# Patient Record
Sex: Male | Born: 1965 | Race: White | Hispanic: No | Marital: Married | Smoking: Former smoker
Health system: Southern US, Community
[De-identification: ages and names within clinical notes are randomized; demographics above are authoritative.]

## PROBLEM LIST (undated history)

## (undated) DIAGNOSIS — I219 Acute myocardial infarction, unspecified: Secondary | ICD-10-CM

## (undated) DIAGNOSIS — E785 Hyperlipidemia, unspecified: Secondary | ICD-10-CM

## (undated) HISTORY — PX: OTHER SURGICAL HISTORY: SHX169

## (undated) HISTORY — PX: KNEE ARTHROSCOPY: SUR90

---

## 2010-05-04 ENCOUNTER — Inpatient Hospital Stay: Payer: Self-pay | Admitting: Internal Medicine

## 2011-12-08 ENCOUNTER — Ambulatory Visit: Payer: Self-pay | Admitting: Otolaryngology

## 2011-12-15 ENCOUNTER — Ambulatory Visit: Payer: Self-pay | Admitting: Otolaryngology

## 2011-12-17 ENCOUNTER — Ambulatory Visit: Payer: Self-pay | Admitting: Otolaryngology

## 2011-12-17 LAB — PROTIME-INR: Prothrombin Time: 12 secs (ref 11.5–14.7)

## 2012-02-05 IMAGING — CR DG CHEST 1V PORT
1 series · 1 of 1 positions shown · non-contrast
Comparison: none

REASON FOR EXAM: CP
COMMENTS:

PROCEDURE:     DXR - DXR PORTABLE CHEST SINGLE VIEW  - May 04, 2010  [DATE]
RESULT:     Comparison: None.

[view not recorded]
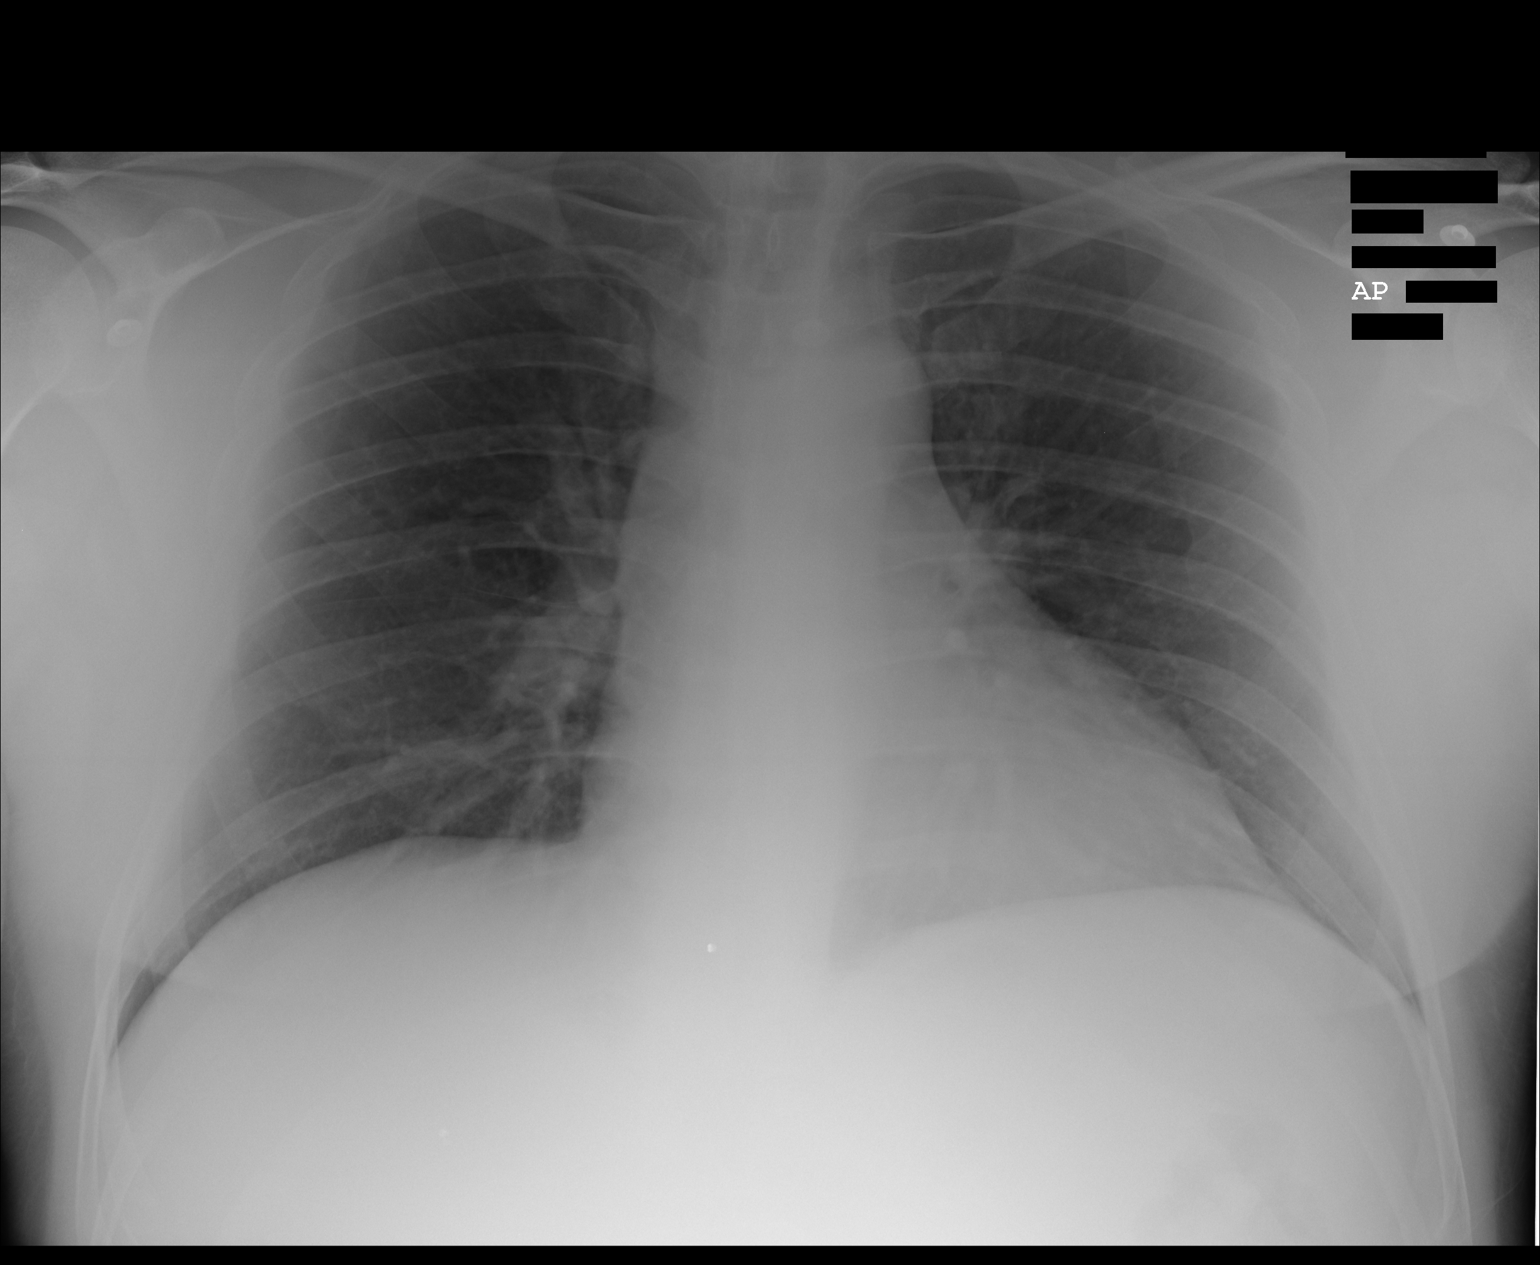

[1 of 1 positions shown; findings below may reference images not displayed]

FINDINGS: The heart is mildly enlarged. The lungs are clear.
IMPRESSION: No acute cardiopulmonary series.

## 2013-09-10 IMAGING — CT CT NECK WITH CONTRAST
1 of 2 series · 9 of 14 positions shown, 12 images · IV contrast (agent unspecified)
Comparison: none

REASON FOR EXAM: rt paratid tail mass
COMMENTS:

PROCEDURE:     KCT - KCT NECK WITH CONTRAST  - December 08, 2011  [DATE]
RESULT:
TECHNIQUE: Helical 3 mm sections were obtained from the skull base to the
vertex status post intravenous administration of 75 ml of Csovue-3IL.

[Series 2: neck 3.0 3 · axial · 0.43mm/px · z∈[-304,-62]mm · 9 of 103 slices shown, 12 images]
[im 11/103  soft-tissue]
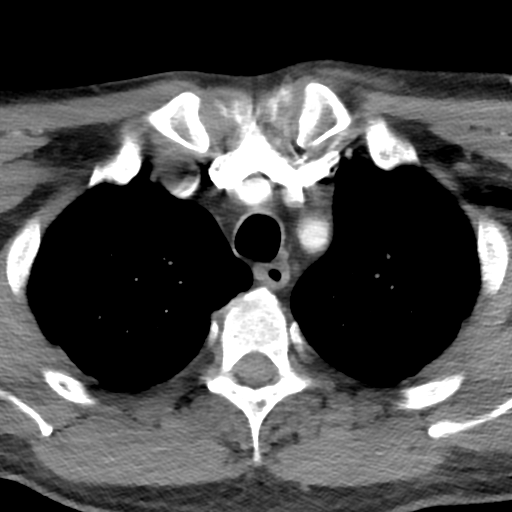
[im 11/103  bone]
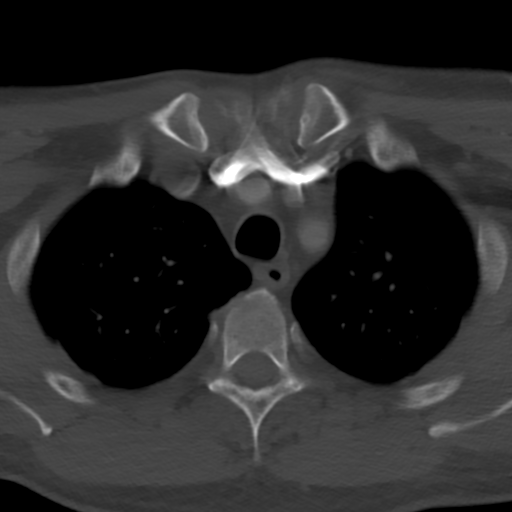
[im 21/103  bone]
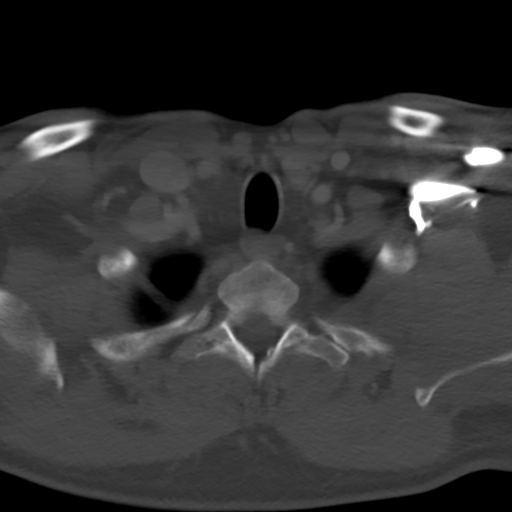
[im 31/103  bone]
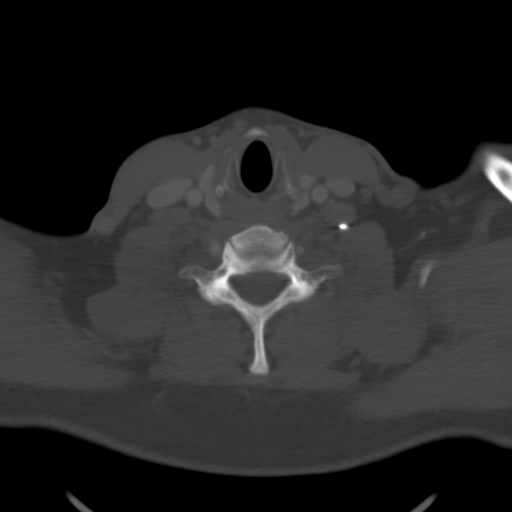
[im 41/103  bone]
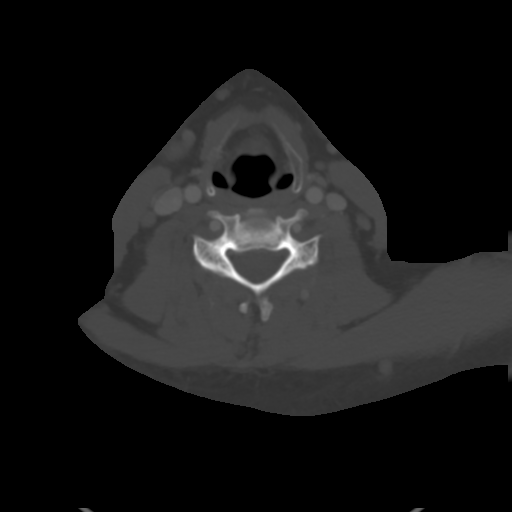
[im 52/103  soft-tissue]
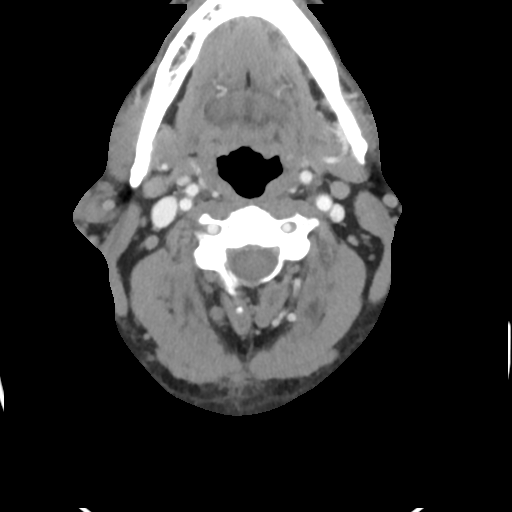
[im 52/103  bone]
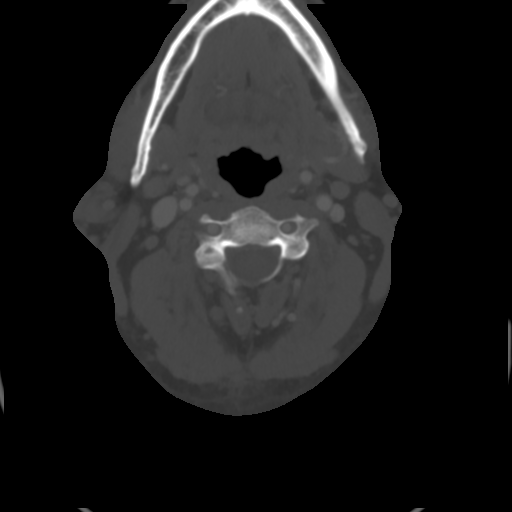
[im 62/103  bone]
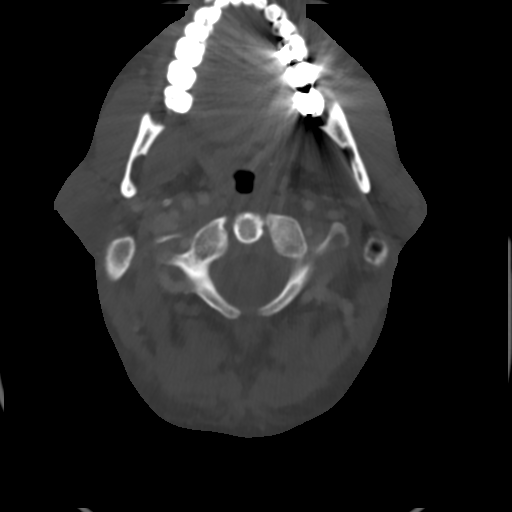
[im 72/103  bone]
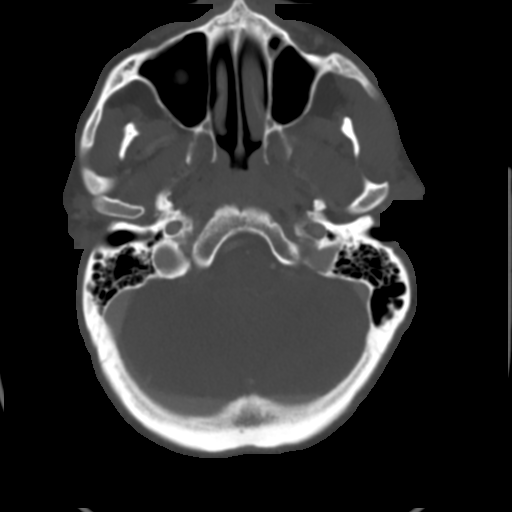
[im 82/103  bone]
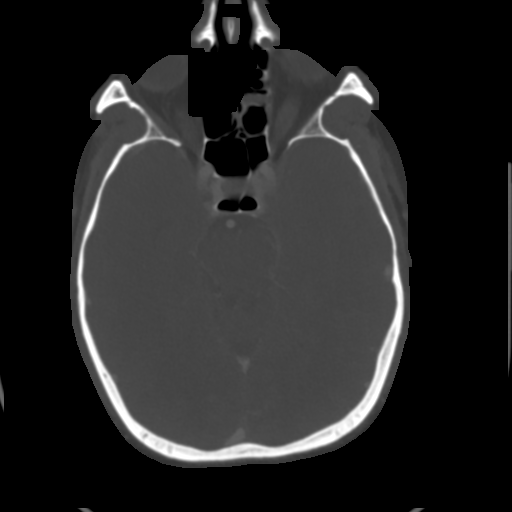
[im 92/103  soft-tissue]
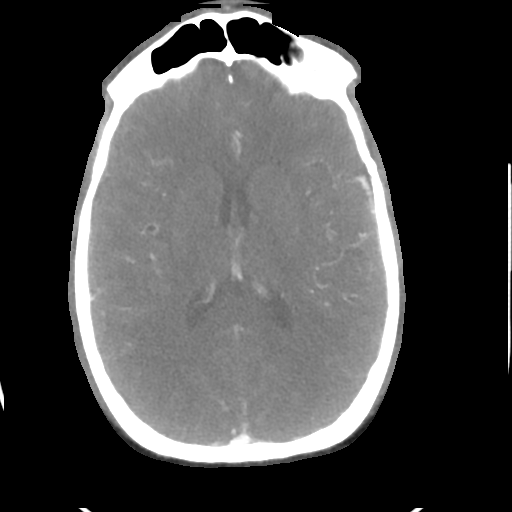
[im 92/103  bone]
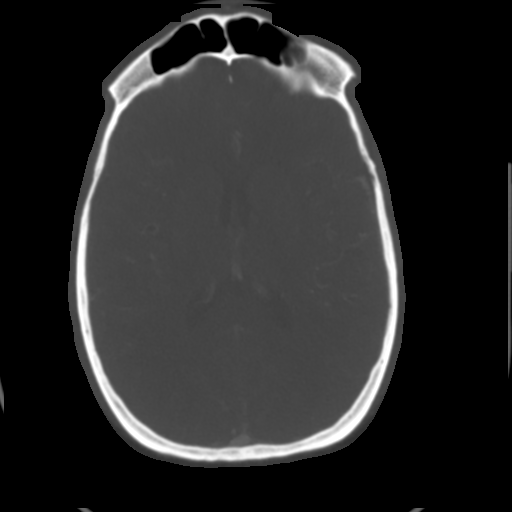

[9 of 14 positions shown; findings below may reference images not displayed]

FINDINGS: A 2.79 x 2.66 cm mild to moderately enhancing mass is identified
deep to the parotid at the level of the ramus of the mandible on the right.
This mass-like area extends to the tail region of the parotid and has a more
solid appearance. Enlarged, paraparotid lymph nodes are identified as well
as prominent lymph nodes in the parotid space. Prominent lymph nodes are
identified within the anterior and posterior cervical chains bilaterally.

Dystrophic calcification is identified within the lingual tonsils. The
glottic and epiglottic regions are otherwise unremarkable. Opacified
vascular structures are unremarkable. The submandibular glands are symmetric
and unremarkable. The left parotid region is unremarkable. The airway is
patent.
IMPRESSION: Mild to moderately heterogeneous enhancing mass deep to the
parotid on the right. There is associated adenopathy particularly within the
parotid space region.

## 2013-12-30 ENCOUNTER — Ambulatory Visit: Payer: Self-pay | Admitting: Gastroenterology

## 2014-01-02 LAB — PATHOLOGY REPORT

## 2014-08-22 NOTE — Op Note (Signed)
PATIENT NAME:  Spencer Preston, Spencer Preston MR#:  [id number] DATE OF BIRTH:  [birth year]  DATE OF PROCEDURE:  12/17/2011  PREOPERATIVE DIAGNOSIS: Right parotid mass.   POSTOPERATIVE DIAGNOSIS: Right parotid mass.  PROCEDURE: Right superficial parotidectomy.   SURGEON: Marion DownerScott Landa Mullinax, MD   FIRST ASSISTANT: Karlyne GreenspanWilliam Vaught, MD   ANESTHESIA: General endotracheal.   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   FINDINGS: This was about a 2.5 cm right parotid mass near the tail of the parotid.   COMPLICATIONS: None.   DESCRIPTION OF PROCEDURE: After obtaining informed consent, the patient was taken to the Operating Room and placed in the supine position. After induction of general endotracheal anesthesia, the patient was turned 90 degrees and placed in a shoulder roll. The skin was injected with 1% lidocaine with epinephrine 1:200,000. He was then prepped and draped in the usual sterile fashion. A 15 blade was used to incise the skin in front of the ear and down into the neck in Preston-shaped type incision. The incision was carried down through the subcutaneous fat with the Bovie. A skin flap was then elevated over the parotid fascia and down into the neck. Dissection proceeded down to the sternocleidomastoid inferiorly, and careful dissection along the tragal cartilage was performed down towards the region of the facial nerve. The intervening tissue between the tragal cartilage and the sternocleidomastoid muscle was divided as encountered predominantly with the Harmonic scalpel. The facial nerve trunk was very difficult to find. It was located a little more inferiorly than usual, and he had a very prominent mastoid tip which contributed to difficulties in identifying the nerve. He also had an extremely thick parotid gland. Eventually, the trunk of the nerve was identified and dissected out. The superficial lobe of the parotid gland was dissected  away from the facial nerve carefully, carefully tracing all branches out as the gland was dissected away. Identity of branches were confirmed as needed with the nerve stimulator. The superior and inferior as well as middle facial nerve branches were all dissected out as the gland was dissected away from the nerve and removed with the tumor. The deep margin of the tumor was right on the region of the facial nerve, but the tumor itself was well encapsulated. With all the nerve branches identified and dissected out, the anterior aspect of the gland was divided, delivering the tumor with it. The wound was irrigated with saline and minor bleeding controlled with the bipolar cautery. The remaining parotid fascia was tacked back as much as possible against the sternocleidomastoid to close some of the dead space and the deep layers closed with 4-0 Vicryl suture. The skin was closed with 5-0 Prolene suture in a running lock stitch. Prior to closure, a #10 TLS drain had been placed and was secured with a 4-0 Ethilon suture and placed to suction. The patient was then returned to the anesthesiologist for awakening. He was awakened and taken to the recovery room in good condition postoperatively. Blood loss was approximately 50 mL.   ____________________________ Spencer GrossPaul Preston. Willeen CassBennett, MD psb:cbb D: 12/17/2011 12:08:20 ET T: 12/17/2011 12:32:14 ET JOB#: [id number]  cc: Spencer GrossPaul Preston. Willeen CassBennett, MD, <Dictator> Sandi MealyPAUL Preston Spencer Trumbull MD ELECTRONICALLY SIGNED 01/13/2012 8:35

## 2019-06-27 ENCOUNTER — Other Ambulatory Visit
Admission: RE | Admit: 2019-06-27 | Discharge: 2019-06-27 | Disposition: A | Discharge status: Home / Self Care | Payer: BC Managed Care – PPO | Source: Ambulatory Visit | Attending: Internal Medicine | Admitting: Internal Medicine

## 2019-06-27 ENCOUNTER — Other Ambulatory Visit: Payer: Self-pay

## 2019-06-27 DIAGNOSIS — Z01812 Encounter for preprocedural laboratory examination: Secondary | ICD-10-CM | POA: Diagnosis present

## 2019-06-27 DIAGNOSIS — Z20822 Contact with and (suspected) exposure to covid-19: Secondary | ICD-10-CM | POA: Insufficient documentation

## 2019-06-27 LAB — SARS CORONAVIRUS 2 (TAT 6-24 HRS): SARS Coronavirus 2: NEGATIVE

## 2019-06-28 ENCOUNTER — Encounter: Payer: Self-pay | Admitting: Internal Medicine

## 2019-06-29 ENCOUNTER — Ambulatory Visit: Payer: BC Managed Care – PPO | Admitting: Anesthesiology

## 2019-06-29 ENCOUNTER — Encounter: Payer: Self-pay | Admitting: Internal Medicine

## 2019-06-29 ENCOUNTER — Other Ambulatory Visit: Payer: Self-pay

## 2019-06-29 ENCOUNTER — Encounter
Admission: RE | Disposition: A | Discharge status: Home / Self Care | Payer: Self-pay | Source: Home / Self Care | Attending: Internal Medicine

## 2019-06-29 ENCOUNTER — Ambulatory Visit
Admission: RE | Admit: 2019-06-29 | Discharge: 2019-06-29 | Disposition: A | Discharge status: Home / Self Care | Payer: BC Managed Care – PPO | Attending: Internal Medicine | Admitting: Internal Medicine

## 2019-06-29 DIAGNOSIS — E785 Hyperlipidemia, unspecified: Secondary | ICD-10-CM | POA: Insufficient documentation

## 2019-06-29 DIAGNOSIS — I252 Old myocardial infarction: Secondary | ICD-10-CM | POA: Diagnosis not present

## 2019-06-29 DIAGNOSIS — Z888 Allergy status to other drugs, medicaments and biological substances status: Secondary | ICD-10-CM | POA: Diagnosis not present

## 2019-06-29 DIAGNOSIS — Z79899 Other long term (current) drug therapy: Secondary | ICD-10-CM | POA: Insufficient documentation

## 2019-06-29 DIAGNOSIS — Z955 Presence of coronary angioplasty implant and graft: Secondary | ICD-10-CM | POA: Insufficient documentation

## 2019-06-29 DIAGNOSIS — I2510 Atherosclerotic heart disease of native coronary artery without angina pectoris: Secondary | ICD-10-CM | POA: Diagnosis not present

## 2019-06-29 DIAGNOSIS — Z1211 Encounter for screening for malignant neoplasm of colon: Secondary | ICD-10-CM | POA: Insufficient documentation

## 2019-06-29 DIAGNOSIS — Z7982 Long term (current) use of aspirin: Secondary | ICD-10-CM | POA: Diagnosis not present

## 2019-06-29 DIAGNOSIS — Z881 Allergy status to other antibiotic agents status: Secondary | ICD-10-CM | POA: Insufficient documentation

## 2019-06-29 DIAGNOSIS — Z86010 Personal history of colonic polyps: Secondary | ICD-10-CM | POA: Diagnosis not present

## 2019-06-29 HISTORY — DX: Acute myocardial infarction, unspecified: I21.9

## 2019-06-29 HISTORY — PX: COLONOSCOPY WITH PROPOFOL: SHX5780

## 2019-06-29 HISTORY — DX: Hyperlipidemia, unspecified: E78.5

## 2019-06-29 SURGERY — COLONOSCOPY WITH PROPOFOL
Anesthesia: General

## 2019-06-29 MED ORDER — PROPOFOL 500 MG/50ML IV EMUL
INTRAVENOUS | Status: DC | PRN
  Administered 2019-06-29: 175 ug/kg/min via INTRAVENOUS

## 2019-06-29 MED ORDER — PROPOFOL 500 MG/50ML IV EMUL
INTRAVENOUS | Status: AC
  Filled 2019-06-29: qty 50

## 2019-06-29 MED ORDER — LIDOCAINE HCL (CARDIAC) PF 100 MG/5ML IV SOSY
PREFILLED_SYRINGE | INTRAVENOUS | Status: DC | PRN
  Administered 2019-06-29: 50 mg via INTRAVENOUS

## 2019-06-29 MED ORDER — SODIUM CHLORIDE 0.9 % IV SOLN: INTRAVENOUS | Status: DC

## 2019-06-29 MED ORDER — PROPOFOL 10 MG/ML IV BOLUS
INTRAVENOUS | Status: DC | PRN
  Administered 2019-06-29: 10 mg via INTRAVENOUS
  Administered 2019-06-29: 70 mg via INTRAVENOUS

## 2019-06-29 NOTE — Anesthesia Preprocedure Evaluation (Signed)
Anesthesia Evaluation  Patient identified by MRN, date of birth, ID band Patient awake    Reviewed: Allergy & Precautions, H&P , NPO status , Patient's Chart, lab work & pertinent test results, reviewed documented beta blocker date and time   History of Anesthesia Complications Negative for: history of anesthetic complications  Airway Mallampati: I  TM Distance: >3 FB Neck ROM: full    Dental  (+) Dental Advidsory Given, Teeth Intact   Pulmonary neg shortness of breath, neg COPD, Recent URI , Resolved, former smoker,    Pulmonary exam normal        Cardiovascular Exercise Tolerance: Good hypertension, (-) angina+ CAD, + Past MI and + Cardiac Stents  (-) CABG Normal cardiovascular exam(-) dysrhythmias (-) Valvular Problems/Murmurs     Neuro/Psych negative neurological ROS  negative psych ROS   GI/Hepatic negative GI ROS, Neg liver ROS,   Endo/Other  negative endocrine ROS  Renal/GU negative Renal ROS  negative genitourinary   Musculoskeletal   Abdominal   Peds  Hematology negative hematology ROS (+)   Anesthesia Other Findings Past Medical History: No date: Hyperlipidemia No date: Myocardial infarction (HCC)   Reproductive/Obstetrics negative OB ROS                             Anesthesia Physical Anesthesia Plan  ASA: II  Anesthesia Plan: General   Post-op Pain Management:    Induction: Intravenous  PONV Risk Score and Plan: 2 and Propofol infusion and TIVA  Airway Management Planned: Nasal Cannula and Natural Airway  Additional Equipment:   Intra-op Plan:   Post-operative Plan:   Informed Consent: I have reviewed the patients History and Physical, chart, labs and discussed the procedure including the risks, benefits and alternatives for the proposed anesthesia with the patient or authorized representative who has indicated his/her understanding and acceptance.      Dental Advisory Given  Plan Discussed with: Anesthesiologist, CRNA and Surgeon  Anesthesia Plan Comments:         Anesthesia Quick Evaluation

## 2019-06-29 NOTE — Anesthesia Procedure Notes (Signed)
Date/Time: 06/29/2019 9:03 AM Performed by: Ginger Carne, CRNA Pre-anesthesia Checklist: Patient identified, Emergency Drugs available, Suction available, Patient being monitored and Timeout performed Patient Re-evaluated:Patient Re-evaluated prior to induction Oxygen Delivery Method: Nasal cannula Preoxygenation: Pre-oxygenation with 100% oxygen

## 2019-06-29 NOTE — Op Note (Signed)
[facility] Gastroenterology Patient Name: Spencer Preston Procedure Date: 06/29/2019 8:55 AM MRN: [id number] Account #: [id number] Date of Birth: [birth year] Admit Type: Outpatient Age: 54 Room: [facility] ENDO ROOM 3 Gender: Male Note Status: Finalized Procedure:             Colonoscopy Indications:           Surveillance: Personal history of adenomatous polyps                         on last colonoscopy > 5 years ago Providers:             Lorie Apley K. Isaack Preble MD, MD Medicines:             Propofol per Anesthesia Complications:         No immediate complications. Procedure:             Pre-Anesthesia Assessment:                        - The risks and benefits of the procedure and the                         sedation options and risks were discussed with the                         patient. All questions were answered and informed                         consent was obtained.                        - Patient identification and proposed procedure were                         verified prior to the procedure by the nurse. The                         procedure was verified in the procedure room.                        - ASA Grade Assessment: III - A patient with severe                         systemic disease.                        - After reviewing the risks and benefits, the patient                         was deemed in satisfactory condition to undergo the                         procedure.                        After obtaining informed consent, the colonoscope was                         passed under direct vision. Throughout the procedure,  the patient's blood pressure, pulse, and oxygen                         saturations were monitored continuously. The                         Colonoscope was introduced through the anus and                         advanced to the the cecum, identified by appendiceal                         orifice and ileocecal  valve. The colonoscopy was                         performed without difficulty. The patient tolerated                         the procedure well. The quality of the bowel                         preparation was good. The ileocecal valve, appendiceal                         orifice, and rectum were photographed. Findings:      The perianal and digital rectal examinations were normal. Pertinent       negatives include normal sphincter tone and no palpable rectal lesions.      The entire examined colon appeared normal on direct and retroflexion       views. Impression:            - The entire examined colon is normal on direct and                         retroflexion views.                        - No specimens collected. Recommendation:        - Patient has a contact number available for                         emergencies. The signs and symptoms of potential                         delayed complications were discussed with the patient.                         Return to normal activities tomorrow. Written                         discharge instructions were provided to the patient.                        - Resume previous diet.                        - Continue present medications.                        - Repeat colonoscopy  in 5 years for surveillance.                        - Return to GI office PRN.                        - The findings and recommendations were discussed with                         the patient. Procedure Code(s):     --- Professional ---                        R4431, Colorectal cancer screening; colonoscopy on                         individual at high risk Diagnosis Code(s):     --- Professional ---                        Z86.010, Personal history of colonic polyps CPT copyright 2019 [employer]. All rights reserved. The codes documented in this report are preliminary and upon coder review may  be revised to meet current compliance  requirements. Stanton Kidney MD, MD 06/29/2019 9:21:23 AM This report has been signed electronically. Number of Addenda: 0 Note Initiated On: 06/29/2019 8:55 AM Scope Withdrawal Time: 0 hours 6 minutes 2 seconds  Total Procedure Duration: 0 hours 8 minutes 9 seconds  Estimated Blood Loss:  Estimated blood loss: none.      [facility]

## 2019-06-29 NOTE — Anesthesia Postprocedure Evaluation (Signed)
Anesthesia Post Note  Patient: Spencer Preston  Procedure(s) Performed: COLONOSCOPY WITH PROPOFOL (N/A )  Patient location during evaluation: Endoscopy Anesthesia Type: General Level of consciousness: awake and alert Pain management: pain level controlled Vital Signs Assessment: post-procedure vital signs reviewed and stable Respiratory status: spontaneous breathing, nonlabored ventilation, respiratory function stable and patient connected to nasal cannula oxygen Cardiovascular status: blood pressure returned to baseline and stable Postop Assessment: no apparent nausea or vomiting Anesthetic complications: no     Last Vitals:  Vitals:   06/29/19 0944 06/29/19 0954  BP: (!) 122/96 (!) 124/92  Pulse:    Resp:    Temp:    SpO2:      Last Pain:  Vitals:   06/29/19 0954  TempSrc:   PainSc: 0-No pain                 Lenard Simmer

## 2019-06-29 NOTE — H&P (Signed)
Outpatient short stay form Pre-procedure 06/29/2019 9:05 AM Spencer Preston K. Norma Fredrickson, M.D.  Primary Physician: Bethann Punches, M.D.  Reason for visit:  Personal hx of colon polyps  History of present illness:  Hx tubular adenoma x1, Dr. Shelle Iron, 2015. Patient denies change in bowel habits, rectal bleeding, weight loss or abdominal pain.      Current Facility-Administered Medications:  .  0.9 %  sodium chloride infusion, , Intravenous, Continuous, Dalton, Boykin Nearing, MD, [place] [facility] at 06/29/19 [phone]  Medications Prior to Admission  Medication Sig Dispense Refill Last Dose  . aspirin EC 81 MG tablet Take 81 mg by mouth daily.   06/28/2019 at Unknown time  . atorvastatin (LIPITOR) 40 MG tablet Take 40 mg by mouth daily.   06/28/2019 at Unknown time  . propranolol (INDERAL) 40 MG tablet Take 40 mg by mouth 2 (two) times daily.   06/28/2019 at Unknown time     Allergies  Allergen Reactions  . Plavix [Clopidogrel] Rash  . Tetracyclines & Related Rash     Past Medical History:  Diagnosis Date  . Hyperlipidemia   . Myocardial infarction [facility])     Review of systems:  Otherwise negative.    Physical Exam  Gen: Alert, oriented. Appears stated age.  HEENT: [place]/AT. PERRLA. Lungs: CTA, no wheezes. CV: RR nl S1, S2. Abd: soft, benign, no masses. BS+ Ext: No edema. Pulses 2+    Planned procedures: Proceed with colonoscopy. The patient understands the nature of the planned procedure, indications, risks, alternatives and potential complications including but not limited to bleeding, infection, perforation, damage to internal organs and possible oversedation/side effects from anesthesia. The patient agrees and gives consent to proceed.  Please refer to procedure notes for findings, recommendations and patient disposition/instructions.     Shiloh Swopes K. Norma Fredrickson, M.D. Gastroenterology 06/29/2019  9:05 AM

## 2019-06-29 NOTE — Transfer of Care (Signed)
Immediate Anesthesia Transfer of Care Note  Patient: Spencer Preston  Procedure(s) Performed: COLONOSCOPY WITH PROPOFOL (N/A )  Patient Location: PACU  Anesthesia Type:General  Level of Consciousness: drowsy  Airway & Oxygen Therapy: Patient Spontanous Breathing  Post-op Assessment: Report given to RN and Post -op Vital signs reviewed and stable  Post vital signs: Reviewed and stable  Last Vitals:  Vitals Value Taken Time  BP 110/68 06/29/19 0925  Temp 36.6 C 06/29/19 0924  Pulse 86 06/29/19 0925  Resp 22 06/29/19 0925  SpO2 96 % 06/29/19 0925  Vitals shown include unvalidated device data.  Last Pain:  Vitals:   06/29/19 0924  TempSrc: Temporal  PainSc: 0-No pain         Complications: No apparent anesthesia complications

## 2019-06-30 ENCOUNTER — Encounter: Payer: Self-pay | Admitting: *Deleted

## 2020-12-25 ENCOUNTER — Other Ambulatory Visit
Admission: RE | Admit: 2020-12-25 | Discharge: 2020-12-25 | Disposition: A | Discharge status: Home / Self Care | Payer: BC Managed Care – PPO | Source: Ambulatory Visit | Attending: Family Medicine | Admitting: Family Medicine

## 2020-12-25 DIAGNOSIS — R509 Fever, unspecified: Secondary | ICD-10-CM | POA: Insufficient documentation

## 2020-12-25 DIAGNOSIS — R058 Other specified cough: Secondary | ICD-10-CM | POA: Insufficient documentation

## 2020-12-25 LAB — BASIC METABOLIC PANEL
Anion gap: 12 (ref 5–15)
BUN: 16 mg/dL (ref 6–20)
CO2: 20 mmol/L — ABNORMAL LOW (ref 22–32)
Calcium: 10.2 mg/dL (ref 8.9–10.3)
Chloride: 100 mmol/L (ref 98–111)
Creatinine, Ser: 1.1 mg/dL (ref 0.61–1.24)
GFR, Estimated: >60 mL/min (ref >60)
Glucose, Bld: 102 mg/dL — ABNORMAL HIGH (ref 70–99)
Potassium: 4.3 mmol/L (ref 3.5–5.1)
Sodium: 132 mmol/L — ABNORMAL LOW (ref 135–145)

## 2023-07-02 ENCOUNTER — Other Ambulatory Visit: Payer: Self-pay | Admitting: Internal Medicine

## 2023-07-02 DIAGNOSIS — R972 Elevated prostate specific antigen [PSA]: Secondary | ICD-10-CM

## 2023-07-07 ENCOUNTER — Ambulatory Visit
Admission: RE | Admit: 2023-07-07 | Discharge: 2023-07-07 | Disposition: A | Discharge status: Home / Self Care | Payer: BC Managed Care – PPO | Source: Ambulatory Visit | Attending: Internal Medicine | Admitting: Internal Medicine

## 2023-07-07 DIAGNOSIS — R972 Elevated prostate specific antigen [PSA]: Secondary | ICD-10-CM | POA: Diagnosis present

## 2023-07-07 MED ORDER — GADOBUTROL 1 MMOL/ML IV SOLN
9.0000 mL | Freq: Once | INTRAVENOUS | Status: AC | PRN
  Administered 2023-07-07: 9 mL via INTRAVENOUS
